# Patient Record
Sex: Female | Born: 1970 | Race: White | Hispanic: No
Health system: Southern US, Community
[De-identification: ages and names within clinical notes are randomized; demographics above are authoritative.]

---

## 1999-10-05 ENCOUNTER — Ambulatory Visit (HOSPITAL_COMMUNITY): Admission: RE | Admit: 1999-10-05 | Discharge: 1999-10-05 | Payer: Self-pay | Admitting: Obstetrics and Gynecology

## 1999-10-05 ENCOUNTER — Encounter: Payer: Self-pay | Admitting: Obstetrics and Gynecology

## 1999-11-22 ENCOUNTER — Ambulatory Visit (HOSPITAL_COMMUNITY): Admission: RE | Admit: 1999-11-22 | Discharge: 1999-11-22 | Payer: Self-pay | Admitting: Obstetrics and Gynecology

## 1999-11-29 ENCOUNTER — Ambulatory Visit (HOSPITAL_COMMUNITY): Admission: RE | Admit: 1999-11-29 | Discharge: 1999-11-29 | Payer: Self-pay | Admitting: Obstetrics and Gynecology

## 1999-12-01 ENCOUNTER — Inpatient Hospital Stay (HOSPITAL_COMMUNITY): Admission: AD | Admit: 1999-12-01 | Discharge: 1999-12-01 | Payer: Self-pay | Admitting: Obstetrics and Gynecology

## 2000-02-05 ENCOUNTER — Inpatient Hospital Stay (HOSPITAL_COMMUNITY): Admission: AD | Admit: 2000-02-05 | Discharge: 2000-02-08 | Payer: Self-pay | Admitting: Obstetrics and Gynecology

## 2000-02-11 ENCOUNTER — Inpatient Hospital Stay (HOSPITAL_COMMUNITY): Admission: AD | Admit: 2000-02-11 | Discharge: 2000-02-11 | Payer: Self-pay | Admitting: Obstetrics and Gynecology

## 2000-06-01 ENCOUNTER — Other Ambulatory Visit: Admission: RE | Admit: 2000-06-01 | Discharge: 2000-06-01 | Payer: Self-pay | Admitting: Obstetrics and Gynecology

## 2002-07-31 ENCOUNTER — Ambulatory Visit (HOSPITAL_COMMUNITY): Admission: RE | Admit: 2002-07-31 | Discharge: 2002-07-31 | Payer: Self-pay | Admitting: Obstetrics and Gynecology

## 2002-10-08 ENCOUNTER — Other Ambulatory Visit: Admission: RE | Admit: 2002-10-08 | Discharge: 2002-10-08 | Payer: Self-pay | Admitting: Obstetrics and Gynecology

## 2003-09-23 ENCOUNTER — Inpatient Hospital Stay (HOSPITAL_COMMUNITY): Admission: AD | Admit: 2003-09-23 | Discharge: 2003-09-23 | Payer: Self-pay | Admitting: Obstetrics and Gynecology

## 2003-10-17 ENCOUNTER — Other Ambulatory Visit: Admission: RE | Admit: 2003-10-17 | Discharge: 2003-10-17 | Payer: Self-pay | Admitting: Obstetrics and Gynecology

## 2003-12-23 ENCOUNTER — Inpatient Hospital Stay (HOSPITAL_COMMUNITY): Admission: AD | Admit: 2003-12-23 | Discharge: 2003-12-23 | Payer: Self-pay | Admitting: Obstetrics and Gynecology

## 2004-06-17 ENCOUNTER — Ambulatory Visit (HOSPITAL_COMMUNITY): Admission: RE | Admit: 2004-06-17 | Discharge: 2004-06-17 | Payer: Self-pay | Admitting: Obstetrics and Gynecology

## 2004-06-18 ENCOUNTER — Ambulatory Visit (HOSPITAL_COMMUNITY): Admission: RE | Admit: 2004-06-18 | Discharge: 2004-06-18 | Payer: Self-pay | Admitting: Obstetrics and Gynecology

## 2005-10-24 ENCOUNTER — Other Ambulatory Visit: Admission: RE | Admit: 2005-10-24 | Discharge: 2005-10-24 | Payer: Self-pay | Admitting: Obstetrics and Gynecology

## 2006-01-09 ENCOUNTER — Inpatient Hospital Stay (HOSPITAL_COMMUNITY): Admission: AD | Admit: 2006-01-09 | Discharge: 2006-01-09 | Payer: Self-pay | Admitting: Obstetrics and Gynecology

## 2006-02-14 ENCOUNTER — Inpatient Hospital Stay (HOSPITAL_COMMUNITY): Admission: AD | Admit: 2006-02-14 | Discharge: 2006-02-14 | Payer: Self-pay | Admitting: Obstetrics and Gynecology

## 2006-02-15 ENCOUNTER — Inpatient Hospital Stay (HOSPITAL_COMMUNITY): Admission: AD | Admit: 2006-02-15 | Discharge: 2006-02-15 | Payer: Self-pay | Admitting: Obstetrics and Gynecology

## 2006-05-02 ENCOUNTER — Inpatient Hospital Stay (HOSPITAL_COMMUNITY): Admission: AD | Admit: 2006-05-02 | Discharge: 2006-05-03 | Payer: Self-pay | Admitting: Obstetrics and Gynecology

## 2021-06-16 ENCOUNTER — Other Ambulatory Visit: Payer: Self-pay | Admitting: Obstetrics and Gynecology

## 2021-06-16 DIAGNOSIS — R928 Other abnormal and inconclusive findings on diagnostic imaging of breast: Secondary | ICD-10-CM

## 2021-06-29 ENCOUNTER — Ambulatory Visit: Payer: Self-pay

## 2021-06-29 ENCOUNTER — Other Ambulatory Visit: Payer: Self-pay

## 2021-06-29 ENCOUNTER — Ambulatory Visit
Admission: RE | Admit: 2021-06-29 | Discharge: 2021-06-29 | Disposition: A | Discharge status: Home / Self Care | Payer: BC Managed Care – PPO | Source: Ambulatory Visit | Attending: Obstetrics and Gynecology | Admitting: Obstetrics and Gynecology

## 2021-06-29 DIAGNOSIS — R928 Other abnormal and inconclusive findings on diagnostic imaging of breast: Secondary | ICD-10-CM

## 2021-12-02 ENCOUNTER — Other Ambulatory Visit: Payer: Self-pay | Admitting: Registered Nurse

## 2021-12-02 DIAGNOSIS — Z8249 Family history of ischemic heart disease and other diseases of the circulatory system: Secondary | ICD-10-CM

## 2021-12-17 ENCOUNTER — Other Ambulatory Visit: Payer: BC Managed Care – PPO

## 2021-12-22 ENCOUNTER — Ambulatory Visit: Payer: Self-pay | Admitting: Dermatology

## 2021-12-24 ENCOUNTER — Other Ambulatory Visit: Payer: BC Managed Care – PPO

## 2021-12-31 ENCOUNTER — Other Ambulatory Visit: Payer: BC Managed Care – PPO

## 2022-01-18 ENCOUNTER — Other Ambulatory Visit: Payer: BC Managed Care – PPO

## 2022-02-11 ENCOUNTER — Ambulatory Visit
Admission: RE | Admit: 2022-02-11 | Discharge: 2022-02-11 | Disposition: A | Discharge status: Home / Self Care | Payer: No Typology Code available for payment source | Source: Ambulatory Visit | Attending: Registered Nurse | Admitting: Registered Nurse

## 2022-02-11 DIAGNOSIS — Z8249 Family history of ischemic heart disease and other diseases of the circulatory system: Secondary | ICD-10-CM

## 2022-06-08 ENCOUNTER — Ambulatory Visit: Payer: BC Managed Care – PPO | Admitting: Dermatology

## 2022-06-08 DIAGNOSIS — D229 Melanocytic nevi, unspecified: Secondary | ICD-10-CM | POA: Diagnosis not present

## 2022-06-08 DIAGNOSIS — D1801 Hemangioma of skin and subcutaneous tissue: Secondary | ICD-10-CM | POA: Diagnosis not present

## 2022-06-08 DIAGNOSIS — L814 Other melanin hyperpigmentation: Secondary | ICD-10-CM

## 2022-06-08 DIAGNOSIS — D1800 Hemangioma unspecified site: Secondary | ICD-10-CM | POA: Diagnosis not present

## 2022-06-08 DIAGNOSIS — L578 Other skin changes due to chronic exposure to nonionizing radiation: Secondary | ICD-10-CM

## 2022-06-08 DIAGNOSIS — L821 Other seborrheic keratosis: Secondary | ICD-10-CM

## 2022-06-08 DIAGNOSIS — Z1283 Encounter for screening for malignant neoplasm of skin: Secondary | ICD-10-CM | POA: Diagnosis not present

## 2022-06-08 DIAGNOSIS — L820 Inflamed seborrheic keratosis: Secondary | ICD-10-CM

## 2022-06-08 NOTE — Progress Notes (Signed)
New Patient Visit  Subjective  Sierra Burke is a 51 y.o. female who presents for the following: New Patient (Initial Visit) (Tbse, denies personal or family hx of skin cancer. Has a few spots under breasts that irritate her and she would like them treated. ).  The patient presents for Total-Body Skin Exam (TBSE) for skin cancer screening and mole check.  The patient has spots, moles and lesions to be evaluated, some may be new or changing and the patient has concerns that these could be cancer.   The following portions of the chart were reviewed this encounter and updated as appropriate:   Allergies  Meds  Problems  Med Hx  Surg Hx  Fam Hx      Review of Systems:  No other skin or systemic complaints except as noted in HPI or Assessment and Plan.  Objective  Well appearing patient in no apparent distress; mood and affect are within normal limits.  A full examination was performed including scalp, head, eyes, ears, nose, lips, neck, chest, axillae, abdomen, back, buttocks, bilateral upper extremities, bilateral lower extremities, hands, feet, fingers, toes, fingernails, and toenails. All findings within normal limits unless otherwise noted below.  Left inferior chest x 1, right inferior chest x 1, left medial breast x 1 (3) Erythematous stuck-on, waxy papule or plaque    Assessment & Plan  Inflamed seborrheic keratosis (3) Left inferior chest x 1, right inferior chest x 1, left medial breast x 1  Symptomatic, irritating, patient would like treated.  Discussed treatment options Ln2 vs bx with cautery  Will treat with LN2 today, advised patient to send mychart message in a couple months if area are not clear. Can bring back for treatment    Destruction of lesion - Left inferior chest x 1, right inferior chest x 1, left medial breast x 1  Destruction method: cryotherapy   Informed consent: discussed and consent obtained   Lesion destroyed using liquid nitrogen: Yes    Cryotherapy cycles:  2 Outcome: patient tolerated procedure well with no complications   Post-procedure details: wound care instructions given   Additional details:  Prior to procedure, discussed risks of blister formation, small wound, skin dyspigmentation, or rare scar following cryotherapy. Recommend Vaseline ointment to treated areas while healing.    Lentigines - Scattered tan macules - Due to sun exposure - Benign-appearing, observe - Recommend daily broad spectrum sunscreen SPF 30+ to sun-exposed areas, reapply every 2 hours as needed. - Call for any changes  Seborrheic Keratoses - Stuck-on, waxy, tan-brown papules and/or plaques  - Benign-appearing - Discussed benign etiology and prognosis. - Observe - Call for any changes  Melanocytic Nevi - Tan-brown and/or pink-flesh-colored symmetric macules and papules - Benign appearing on exam today - Observation - Call clinic for new or changing moles - Recommend daily use of broad spectrum spf 30+ sunscreen to sun-exposed areas.   Hemangiomas - Red papules - Discussed benign nature - Observe - Call for any changes  Actinic Damage - Chronic condition, secondary to cumulative UV/sun exposure - diffuse scaly erythematous macules with underlying dyspigmentation - Recommend daily broad spectrum sunscreen SPF 30+ to sun-exposed areas, reapply every 2 hours as needed.  - Staying in the shade or wearing long sleeves, sun glasses (UVA+UVB protection) and wide brim hats (4-inch brim around the entire circumference of the hat) are also recommended for sun protection.  - Call for new or changing lesions.  Skin cancer screening performed today. Return for 1-2 year  tbse.  I, Ruthell Rummage, CMA, am acting as scribe for Forest Gleason, MD.  Documentation: I have reviewed the above documentation for accuracy and completeness, and I agree with the above.  Forest Gleason, MD

## 2022-06-08 NOTE — Patient Instructions (Addendum)
If any spots we treated today do not go away send mychart message. Can bring back to recheck.    Cryotherapy Aftercare  Wash gently with soap and water everyday.   Apply Vaseline and Band-Aid daily until healed.    Seborrheic Keratosis  What causes seborrheic keratoses? Seborrheic keratoses are harmless, common skin growths that first appear during adult life.  As time goes by, more growths appear.  Some people may develop a large number of them.  Seborrheic keratoses appear on both covered and uncovered body parts.  They are not caused by sunlight.  The tendency to develop seborrheic keratoses can be inherited.  They vary in color from skin-colored to gray, brown, or even black.  They can be either smooth or have a rough, warty surface.   Seborrheic keratoses are superficial and look as if they were stuck on the skin.  Under the microscope this type of keratosis looks like layers upon layers of skin.  That is why at times the top layer may seem to fall off, but the rest of the growth remains and re-grows.    Treatment Seborrheic keratoses do not need to be treated, but can easily be removed in the office.  Seborrheic keratoses often cause symptoms when they rub on clothing or jewelry.  Lesions can be in the way of shaving.  If they become inflamed, they can cause itching, soreness, or burning.  Removal of a seborrheic keratosis can be accomplished by freezing, burning, or surgery. If any spot bleeds, scabs, or grows rapidly, please return to have it checked, as these can be an indication of a skin cancer.   Recommend taking Heliocare sun protection supplement daily in sunny weather for additional sun protection. For maximum protection on the sunniest days, you can take up to 2 capsules of regular Heliocare OR take 1 capsule of Heliocare Ultra. For prolonged exposure (such as a full day in the sun), you can repeat your dose of the supplement 4 hours after your first dose. Heliocare can be  purchased at [employer], at some [facility] or at [email or web address].    Melanoma ABCDEs  Melanoma is the most dangerous type of skin cancer, and is the leading cause of death from skin disease.  You are more likely to develop melanoma if you: Have light-colored skin, light-colored eyes, or red or blond hair Spend a lot of time in the sun Tan regularly, either outdoors or in a tanning bed Have had blistering sunburns, especially during childhood Have a close family member who has had a melanoma Have atypical moles or large birthmarks  Early detection of melanoma is key since treatment is typically straightforward and cure rates are extremely high if we catch it early.   The first sign of melanoma is often a change in a mole or a new dark spot.  The ABCDE system is a way of remembering the signs of melanoma.  A for asymmetry:  The two halves do not match. B for border:  The edges of the growth are irregular. C for color:  A mixture of colors are present instead of an even brown color. D for diameter:  Melanomas are usually (but not always) greater than 70m - the size of a pencil eraser. E for evolution:  The spot keeps changing in size, shape, and color.  Please check your skin once per month between visits. You can use a small mirror in front and a large mirror behind you to keep an eye on  the back side or your body.   If you see any new or changing lesions before your next follow-up, please call to schedule a visit.  Please continue daily skin protection including broad spectrum sunscreen SPF 30+ to sun-exposed areas, reapplying every 2 hours as needed when you're outdoors.   Staying in the shade or wearing long sleeves, sun glasses (UVA+UVB protection) and wide brim hats (4-inch brim around the entire circumference of the hat) are also recommended for sun protection.    Due to recent changes in healthcare laws, you may see results of your pathology and/or laboratory studies on  MyChart before the doctors have had a chance to review them. We understand that in some cases there may be results that are confusing or concerning to you. Please understand that not all results are received at the same time and often the doctors may need to interpret multiple results in order to provide you with the best plan of care or course of treatment. Therefore, we ask that you please give [place] 2 business days to thoroughly review all your results before contacting the office for clarification. Should we see a critical lab result, you will be contacted sooner.   If You Need Anything After Your Visit  If you have any questions or concerns for your doctor, please call our main line at [phone] and press option 4 to reach your doctor's medical assistant. If no one answers, please leave a voicemail as directed and we will return your call as soon as possible. Messages left after 4 pm will be answered the following business day.   You may also send [place] a message via [place]. We typically respond to MyChart messages within 1-2 business days.  For prescription refills, please ask your pharmacy to contact our office. Our fax number is [phone].  If you have an urgent issue when the clinic is closed that cannot wait until the next business day, you can page your doctor at the number below.    Please note that while we do our best to be available for urgent issues outside of office hours, we are not available 24/7.   If you have an urgent issue and are unable to reach [place], you may choose to seek medical care at your doctor's office, retail clinic, urgent care center, or emergency room.  If you have a medical emergency, please immediately call 911 or go to the emergency department.  Pager Numbers  - Dr. Nehemiah Massed: [phone]  - Dr. Laurence Ferrari: [phone]  - Dr. Nicole Kindred: [phone]  In the event of inclement weather, please call our main line at [phone] for an update on the status of  any delays or closures.  Dermatology Medication Tips: Please keep the boxes that topical medications come in in order to help keep track of the instructions about where and how to use these. Pharmacies typically print the medication instructions only on the boxes and not directly on the medication tubes.   If your medication is too expensive, please contact our office at [phone] option 4 or send [place] a message through [place].   We are unable to tell what your co-pay for medications will be in advance as this is different depending on your insurance coverage. However, we may be able to find a substitute medication at lower cost or fill out paperwork to get insurance to cover a needed medication.   If a prior authorization is required to get your medication covered by your insurance company, please allow [place]  1-2 business days to complete this process.  Drug prices often vary depending on where the prescription is filled and some pharmacies may offer cheaper prices.  The website www.goodrx.com contains coupons for medications through different pharmacies. The prices here do not account for what the cost may be with help from insurance (it may be cheaper with your insurance), but the website can give you the price if you did not use any insurance.  - You can print the associated coupon and take it with your prescription to the pharmacy.  - You may also stop by our office during regular business hours and pick up a GoodRx coupon card.  - If you need your prescription sent electronically to a different pharmacy, notify our office through [facility] or by phone at [phone] option 4.     Si Usted Necesita Algo Despus de Su Visita  Tambin puede enviarnos un mensaje a travs de Pharmacist, community. Por lo general respondemos a los mensajes de MyChart en el transcurso de 1 a 2 das hbiles.  Para renovar recetas, por favor pida a su farmacia que se ponga en contacto con nuestra oficina. Harland Dingwall de fax es [place] [phone].  Si tiene un asunto urgente cuando la clnica est cerrada y que no puede esperar hasta el siguiente da hbil, puede llamar/localizar a su doctor(a) al nmero que aparece a continuacin.   Por favor, tenga en cuenta que aunque hacemos todo lo posible para estar disponibles para asuntos urgentes fuera del horario de [place], no estamos disponibles las 24 horas del da, los 7 das de la [place].   Si tiene un problema urgente y no puede comunicarse con nosotros, puede optar por buscar atencin mdica  en el consultorio de su doctor(a), en una clnica privada, en un centro de atencin urgente o en una sala de emergencias.  Si tiene Engineering geologist, por favor llame inmediatamente al 911 o vaya a la sala de emergencias.  Nmeros de bper  - Dr. Nehemiah Massed: [phone]  - Dra. Moye: [phone]  - Dra. Nicole Kindred: [phone]  En caso de inclemencias del [place], por favor llame a Johnsie Kindred principal al [phone] para una actualizacin sobre el [place] de cualquier retraso o cierre.  Consejos para la medicacin en dermatologa: Por favor, guarde las cajas en las que vienen los medicamentos de uso tpico para ayudarle a seguir las instrucciones sobre dnde y cmo usarlos. Las farmacias generalmente imprimen las instrucciones del medicamento slo en las cajas y no directamente en los tubos del [place].   Si su medicamento es muy caro, por favor, pngase en contacto con Zigmund Daniel llamando al [phone] y presione la opcin 4 o envenos un mensaje a travs de Pharmacist, community.   No podemos decirle cul ser su copago por los medicamentos por adelantado ya que esto es diferente dependiendo de la cobertura de su seguro. Sin embargo, es posible que podamos encontrar un medicamento sustituto a Electrical engineer un formulario para que el seguro cubra el medicamento que se considera necesario.   Si se requiere una autorizacin previa para que su compaa de  seguros [place] su medicamento, por favor permtanos de 1 a 2 das hbiles para completar este proceso.  Los precios de los medicamentos varan con frecuencia dependiendo del Environmental consultant de dnde se surte la receta y alguna farmacias pueden ofrecer precios ms baratos.  El sitio web www.goodrx.com tiene cupones para medicamentos de Airline pilot. Los precios aqu no tienen en cuenta lo que podra costar  lo que podra costar con la ayuda del seguro (puede ser ms barato con su seguro), pero el sitio web puede darle el precio si no utiliz ningn seguro.  - Puede imprimir el cupn correspondiente y llevarlo con su receta a la farmacia.  - Tambin puede pasar por nuestra oficina durante el horario de atencin regular y recoger una tarjeta de cupones de GoodRx.  - Si necesita que su receta se enve electrnicamente a una farmacia diferente, informe a nuestra oficina a travs de MyChart de [place] o por telfono llamando al [phone] y presione la opcin 4.  

## 2022-06-13 ENCOUNTER — Encounter: Payer: Self-pay | Admitting: Dermatology

## 2022-07-19 IMAGING — MG DIGITAL DIAGNOSTIC BILAT W/ TOMO W/ CAD
6 of 10 series · 6 of 30 positions shown · non-contrast
Comparison: Previous exam(s).

CLINICAL DATA: Patient was recalled from screening mammogram for a
possible mass in both breast.

EXAM:
DIGITAL DIAGNOSTIC BILATERAL MAMMOGRAM WITH TOMOSYNTHESIS AND CAD
TECHNIQUE: Bilateral digital diagnostic mammography and breast tomosynthesis
was performed. The images were evaluated with computer-aided
detection.

[L XCCL synth-2D]
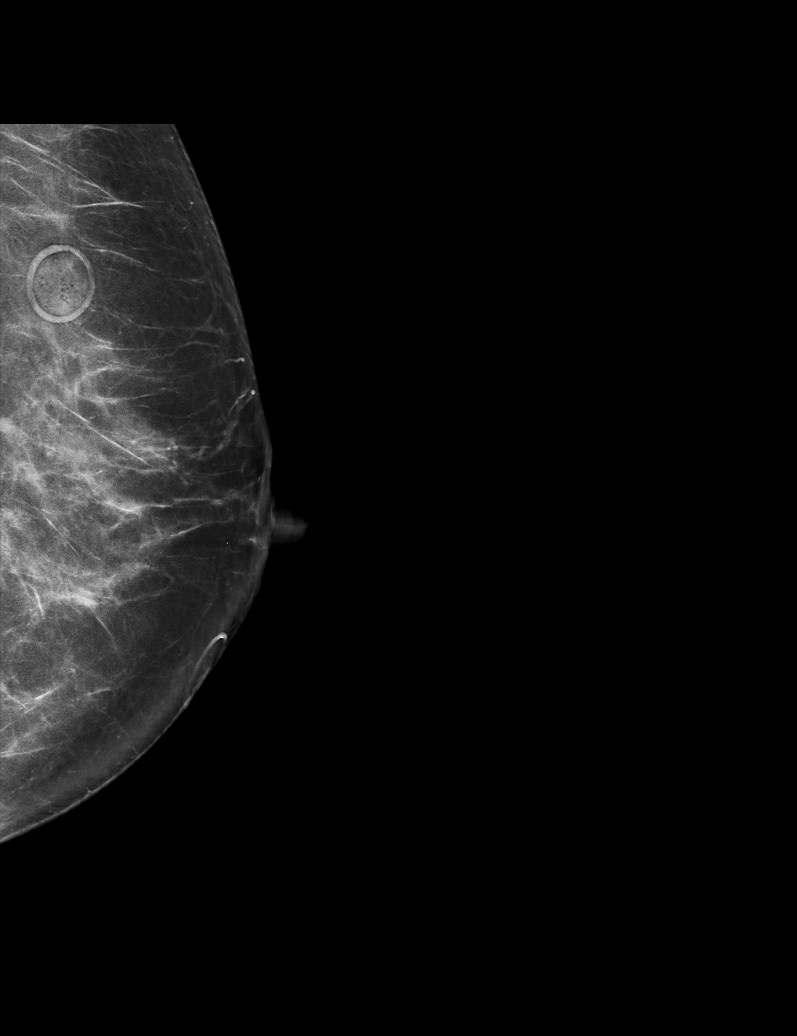

[R CC synth-2D]
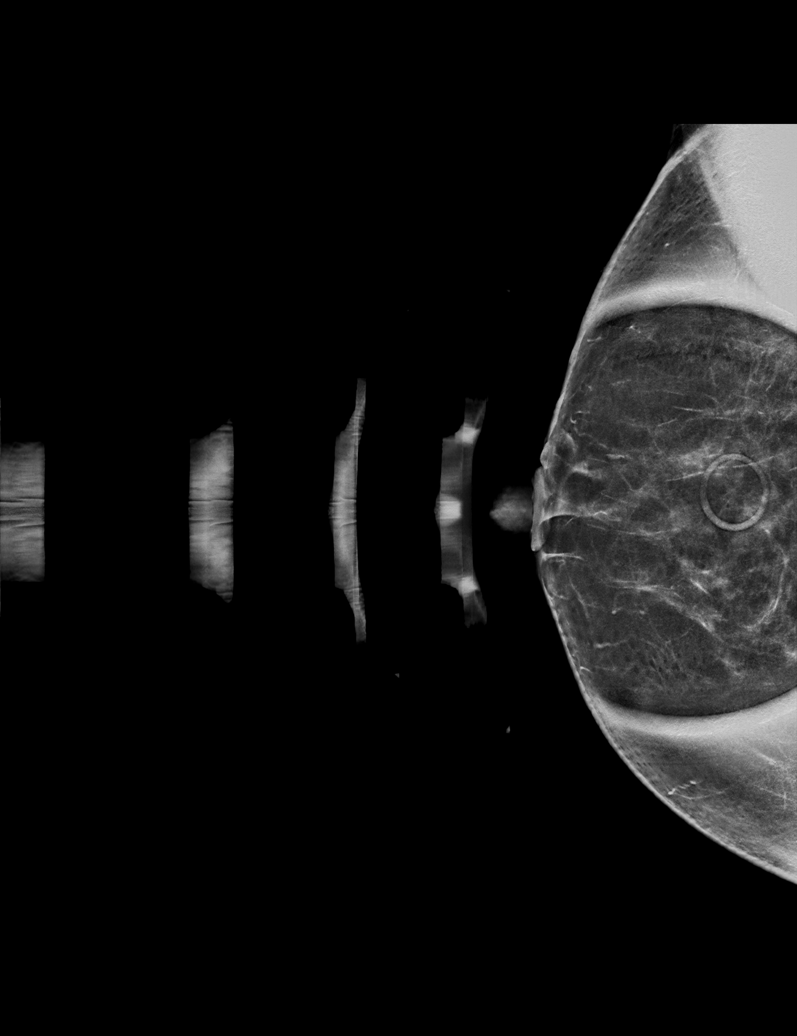

[L ML synth-2D]
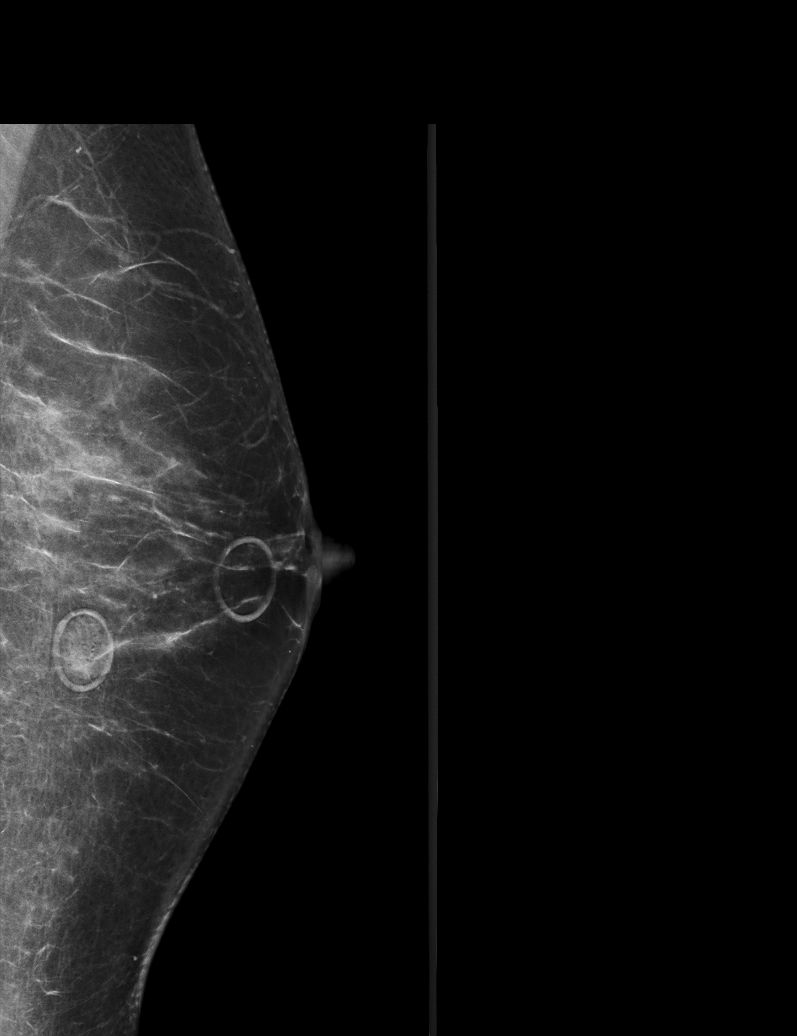

[R ML synth-2D]
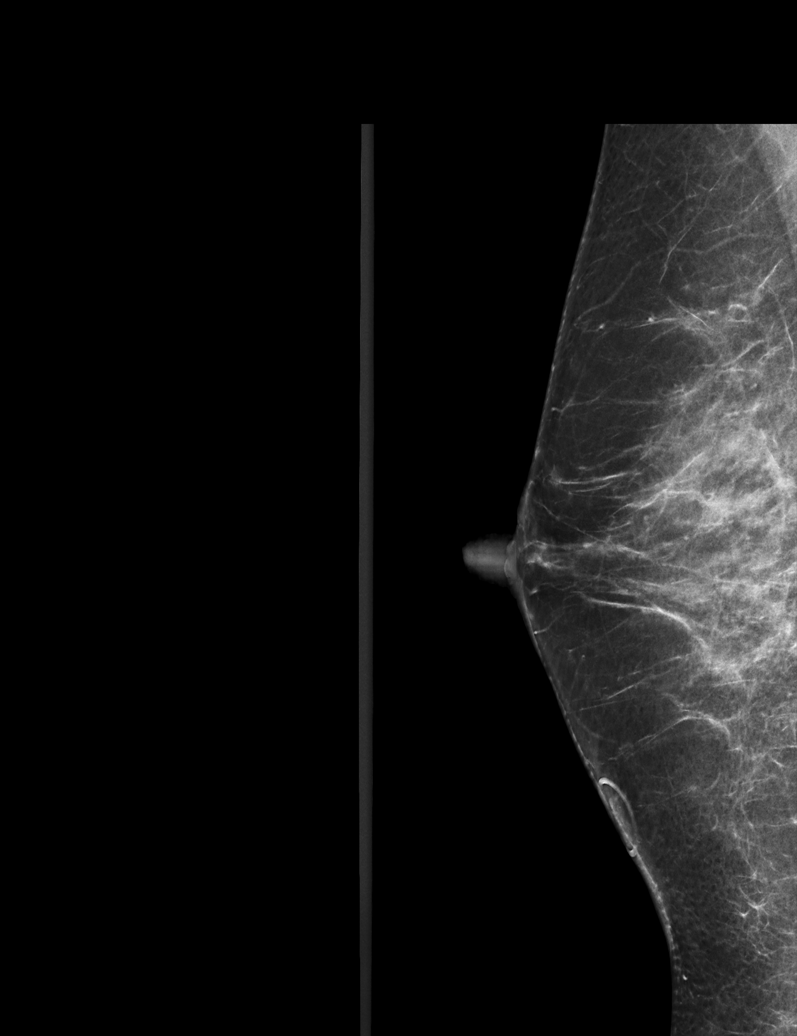

[L MLO synth-2D]
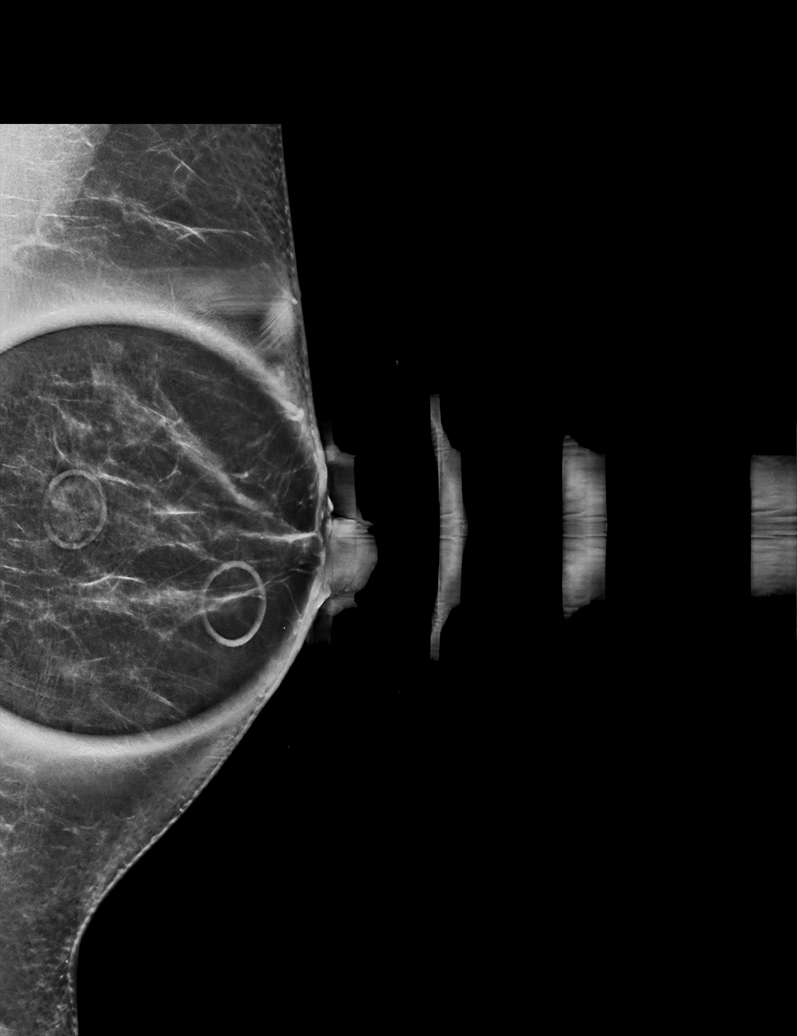

[R CC tomo · tomo slice 35/68.0]
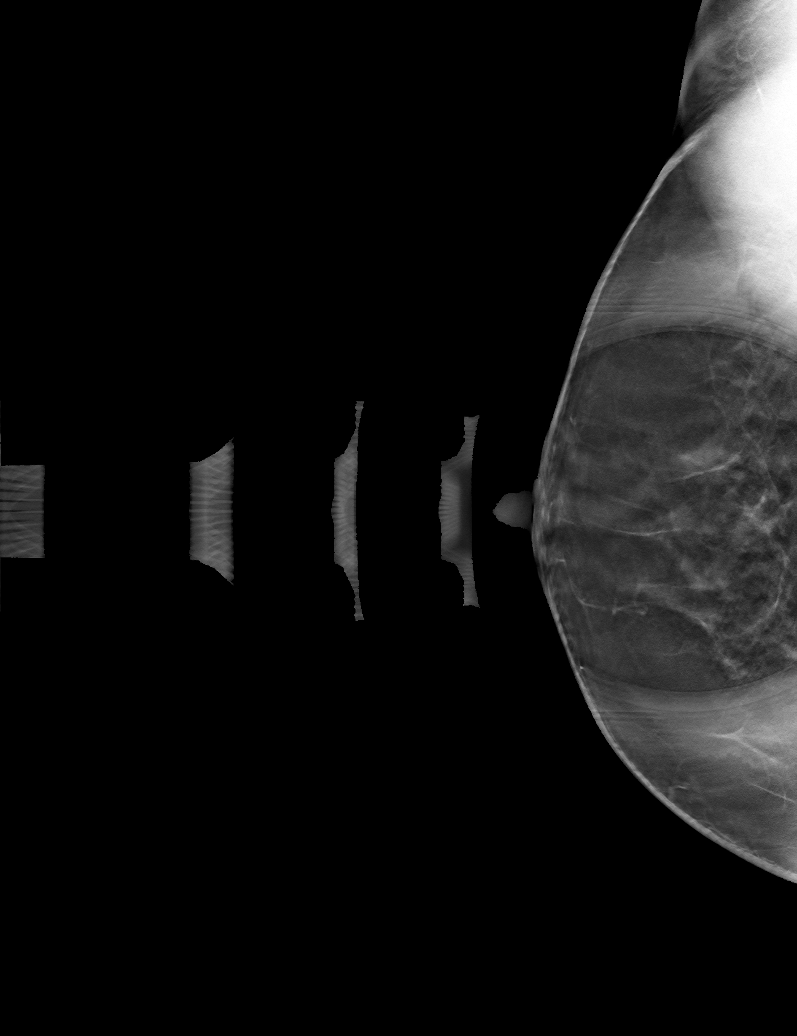

[6 of 30 positions shown; findings below may reference images not displayed]

ACR Breast Density Category c: The breast tissue is heterogeneously
dense, which may obscure small masses.
FINDINGS: Additional imaging of each breast was performed. No suspicious mass
or malignant type microcalcifications identified in either breast.
IMPRESSION: No evidence of malignancy in either breast.

RECOMMENDATION:
Bilateral screening mammogram in 1 year is recommended.

I have discussed the findings and recommendations with the patient.
If applicable, a reminder letter will be sent to the patient
regarding the next appointment.

BI-RADS CATEGORY  1: Negative.
